# Patient Record
Sex: Female | Born: 1970 | Hispanic: No | Marital: Single
Health system: Southern US, Community
[De-identification: ages and names within clinical notes are randomized; demographics above are authoritative.]

---

## 2008-04-19 ENCOUNTER — Ambulatory Visit: Payer: Self-pay | Admitting: Family Medicine

## 2009-01-09 ENCOUNTER — Ambulatory Visit: Payer: Self-pay | Admitting: Gastroenterology

## 2009-05-14 ENCOUNTER — Ambulatory Visit: Payer: Self-pay | Admitting: Family Medicine

## 2009-06-17 ENCOUNTER — Ambulatory Visit: Payer: Self-pay

## 2009-06-18 ENCOUNTER — Ambulatory Visit: Payer: Self-pay | Admitting: Family Medicine

## 2009-08-27 ENCOUNTER — Ambulatory Visit: Payer: Self-pay | Admitting: Gastroenterology

## 2010-01-12 ENCOUNTER — Emergency Department: Payer: Self-pay | Admitting: Emergency Medicine

## 2010-07-18 ENCOUNTER — Emergency Department: Payer: Self-pay | Admitting: Internal Medicine

## 2010-11-16 ENCOUNTER — Other Ambulatory Visit: Payer: Self-pay | Admitting: Physical Medicine and Rehabilitation

## 2010-11-16 DIAGNOSIS — G8929 Other chronic pain: Secondary | ICD-10-CM

## 2010-11-16 DIAGNOSIS — M5020 Other cervical disc displacement, unspecified cervical region: Secondary | ICD-10-CM

## 2010-11-20 ENCOUNTER — Ambulatory Visit
Admission: RE | Admit: 2010-11-20 | Discharge: 2010-11-20 | Disposition: A | Discharge status: Home / Self Care | Payer: BC Managed Care – PPO | Source: Ambulatory Visit | Attending: Physical Medicine and Rehabilitation | Admitting: Physical Medicine and Rehabilitation

## 2010-11-20 DIAGNOSIS — M5020 Other cervical disc displacement, unspecified cervical region: Secondary | ICD-10-CM

## 2010-11-20 DIAGNOSIS — M542 Cervicalgia: Secondary | ICD-10-CM

## 2012-02-01 ENCOUNTER — Other Ambulatory Visit: Payer: Self-pay | Admitting: Family Medicine

## 2012-02-01 NOTE — Telephone Encounter (Signed)
No paper chart °

## 2012-02-01 NOTE — Telephone Encounter (Signed)
Please pull paper chart.  

## 2012-02-11 IMAGING — US US PELV - US TRANSVAGINAL
1 series · 17 of 25 positions shown · non-contrast
Comparison: none

REASON FOR EXAM: menorrhagia
COMMENTS:

[Series 1: us pelv - us transvaginal · 17 of 61 slices shown]
[im 1/61]
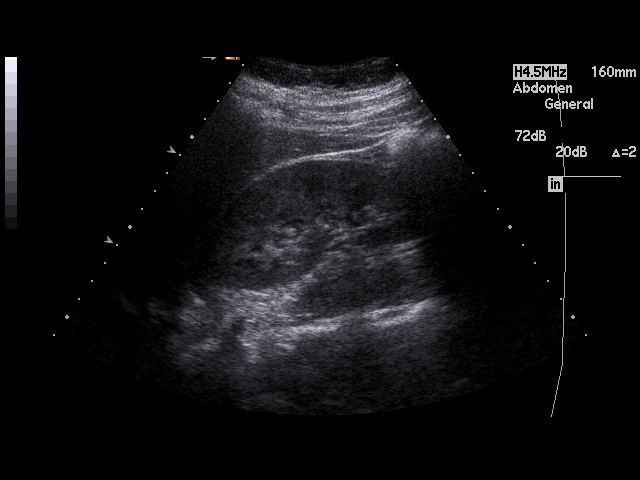
[im 6/61]
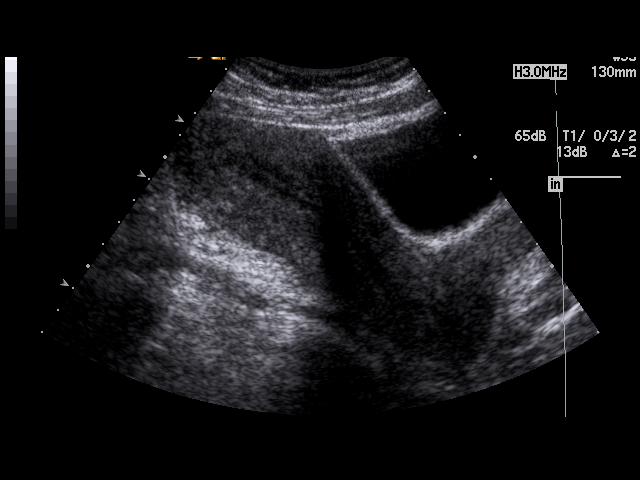
[im 8/61]
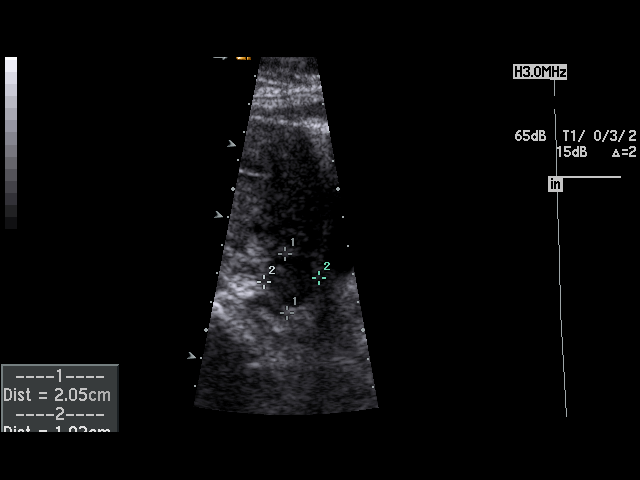
[im 13/61]
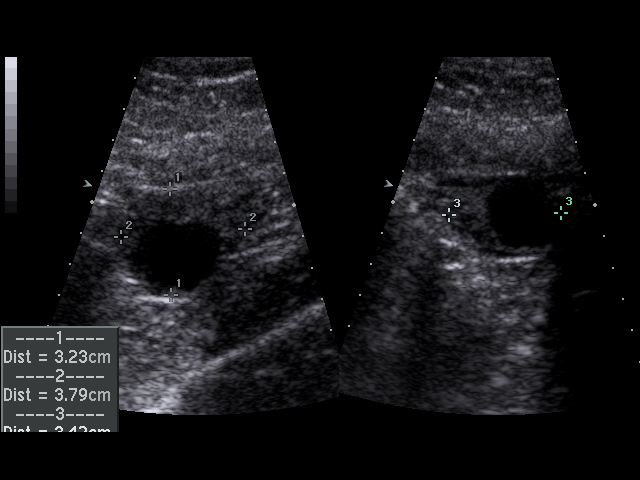
[im 16/61]
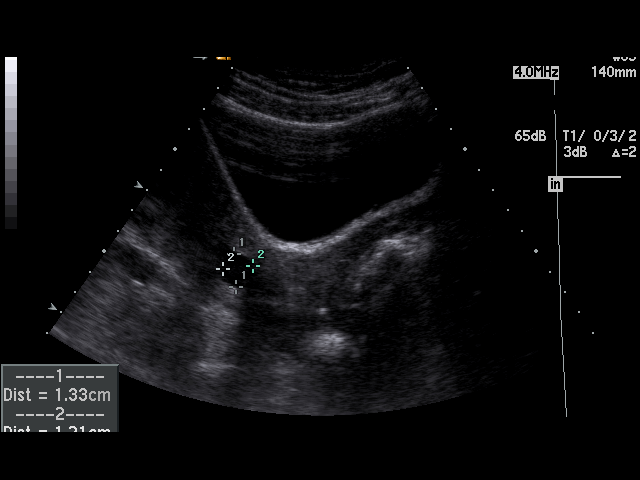
[im 21/61]
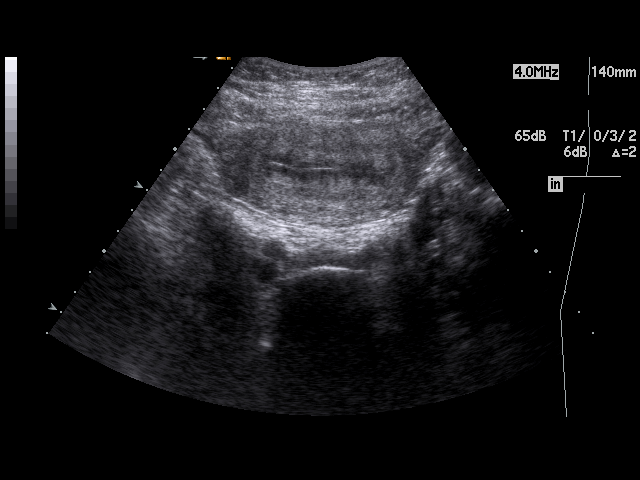
[im 23/61]
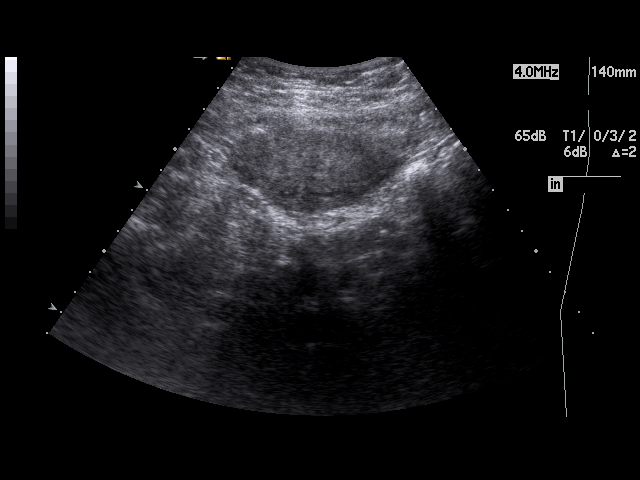
[im 28/61]
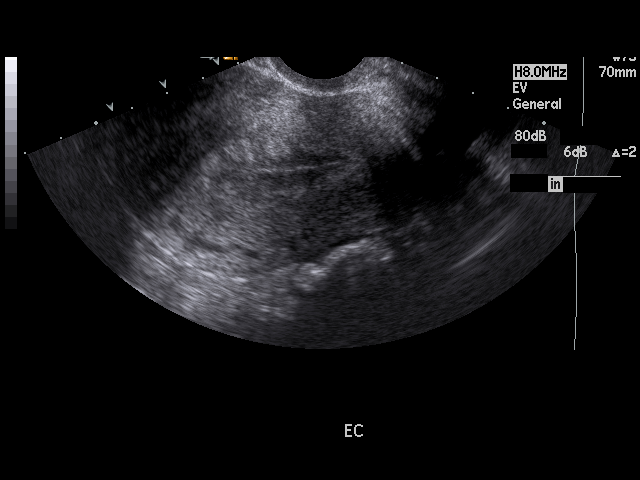
[im 31/61]
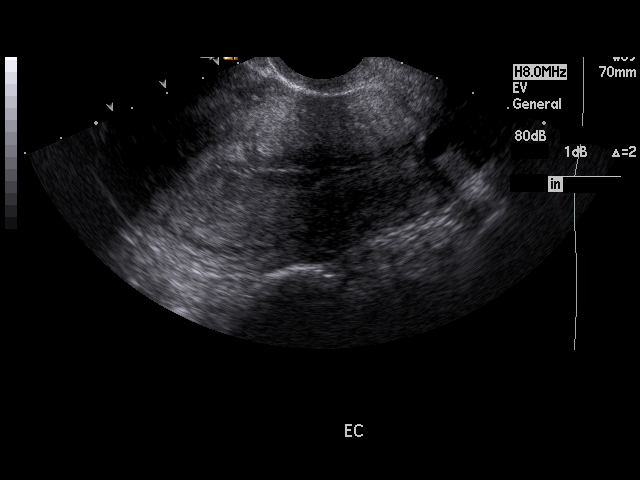
[im 33/61]
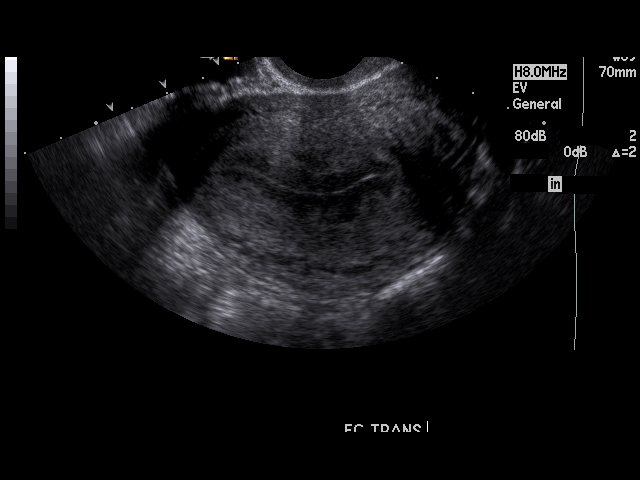
[im 38/61]
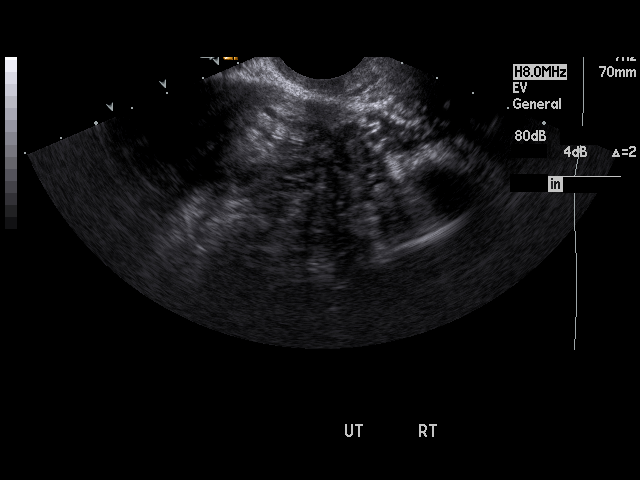
[im 41/61]
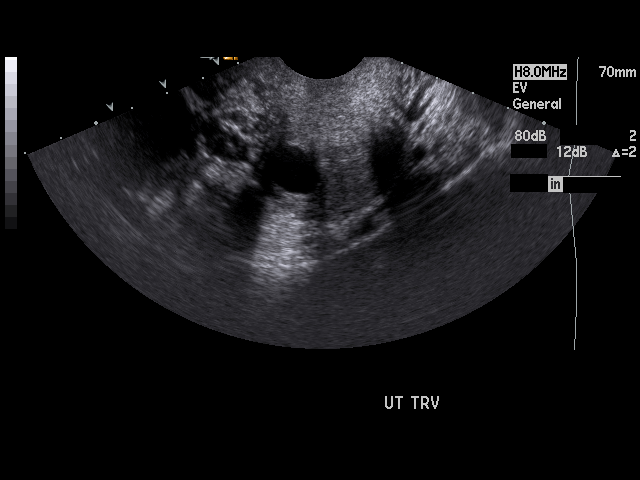
[im 46/61]
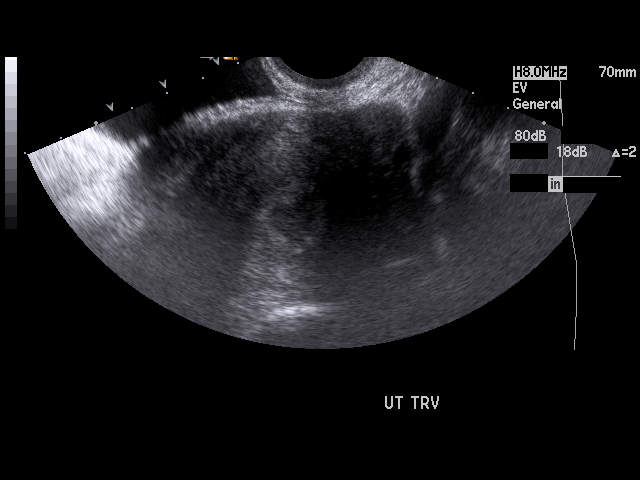
[im 48/61]
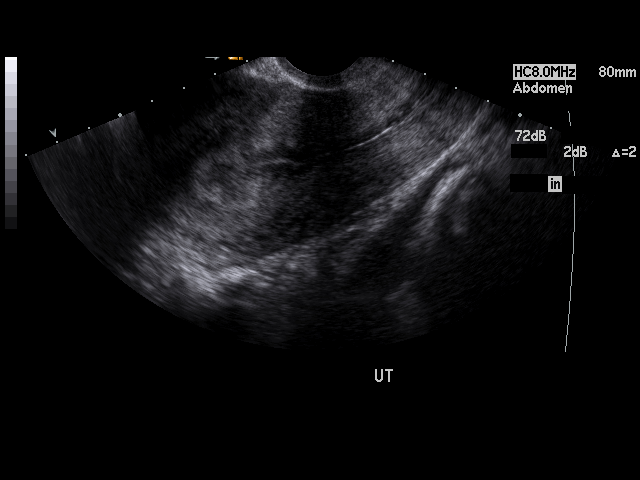
[im 53/61]
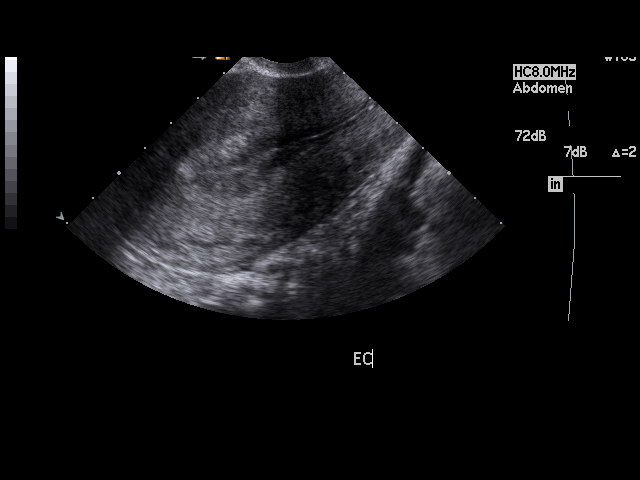
[im 56/61]
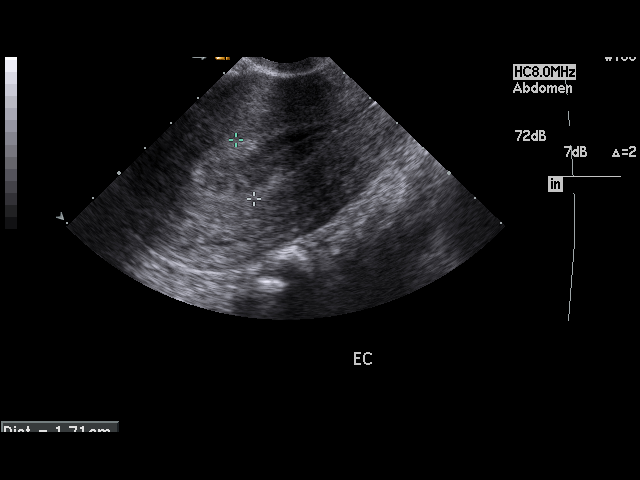
[im 61/61]
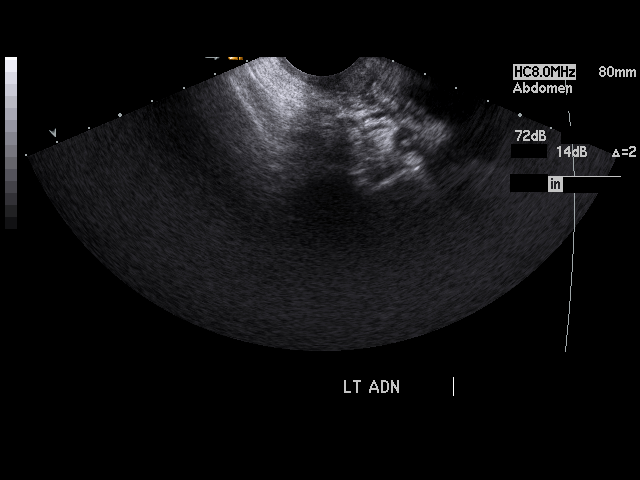

[17 of 25 positions shown; findings below may reference images not displayed]

PROCEDURE:     US  - US PELVIS EXAM W/TRANSVAGINAL  - May 14, 2009 [DATE]

RESULT:     Transabdominal and endovaginal ultrasound was performed. The
uterus measures 13.3 cm x 7.5 cm x 5.2 cm. No uterine mass lesions are seen.
The endometrium measures 17 mm in thickness. There is an irregular
appearance of the interface between the endometrium and myometrium. The
significance of this is uncertain. The right and left ovaries are
visualized. The right ovary measures 2.54 cm at maximum diameter and the
left ovary measures 3.42 cm at maximum diameter. There is a 2.59 cm cyst of
the left ovary. No additional adnexal masses are seen. There is incidentally
noted a nabothian cyst in the cervix. This cyst measures 1.33 cm in
diameter. No free fluid is seen in the pelvis. The kidneys show no
hydronephrosis. The visualized portion of the urinary bladder is normal in
appearance.
IMPRESSION: 1. No uterine mass lesions are identified.
2. The endometrium is thickened and has an irregular interface between the
endometrium and myometrium. The significance of this is to me uncertain.
3. A left ovarian cyst is noted.
4. No free fluid is seen in the pelvis.

## 2012-03-10 ENCOUNTER — Ambulatory Visit: Payer: Self-pay | Admitting: Family Medicine

## 2012-04-03 ENCOUNTER — Ambulatory Visit: Payer: Self-pay | Admitting: Family Medicine

## 2013-01-10 ENCOUNTER — Ambulatory Visit: Payer: Self-pay | Admitting: Obstetrics and Gynecology

## 2013-01-10 LAB — BASIC METABOLIC PANEL WITH GFR
Anion Gap: 6 — ABNORMAL LOW (ref 7–16)
BUN: 10 mg/dL (ref 7–18)
Calcium, Total: 8.3 mg/dL — ABNORMAL LOW (ref 8.5–10.1)
Chloride: 106 mmol/L (ref 98–107)
Co2: 24 mmol/L (ref 21–32)
Creatinine: 0.82 mg/dL (ref 0.60–1.30)
EGFR (African American): >60
EGFR (Non-African Amer.): >60
Glucose: 94 mg/dL (ref 65–99)
Osmolality: 271 (ref 275–301)
Potassium: 3.8 mmol/L (ref 3.5–5.1)
Sodium: 136 mmol/L (ref 136–145)

## 2013-01-10 LAB — CBC
HCT: 36.9 % (ref 35.0–47.0)
MCHC: 33.1 g/dL (ref 32.0–36.0)
MCV: 87 fL (ref 80–100)
RDW: 13.8 % (ref 11.5–14.5)
WBC: 5.8 10*3/uL (ref 3.6–11.0)

## 2013-01-18 ENCOUNTER — Ambulatory Visit: Payer: Self-pay | Admitting: Obstetrics and Gynecology

## 2013-01-22 LAB — PATHOLOGY REPORT

## 2013-03-26 ENCOUNTER — Ambulatory Visit: Payer: Self-pay | Admitting: Obstetrics and Gynecology

## 2013-03-26 LAB — BASIC METABOLIC PANEL
ANION GAP: 3 — AB (ref 7–16)
BUN: 11 mg/dL (ref 7–18)
CO2: 29 mmol/L (ref 21–32)
Calcium, Total: 9.2 mg/dL (ref 8.5–10.1)
Chloride: 106 mmol/L (ref 98–107)
Creatinine: 0.87 mg/dL (ref 0.60–1.30)
Glucose: 79 mg/dL (ref 65–99)
Osmolality: 274 (ref 275–301)
Potassium: 4 mmol/L (ref 3.5–5.1)
SODIUM: 138 mmol/L (ref 136–145)

## 2013-03-26 LAB — CBC
HCT: 34.7 % — AB (ref 35.0–47.0)
HGB: 11.3 g/dL — AB (ref 12.0–16.0)
MCH: 28.2 pg (ref 26.0–34.0)
MCHC: 32.5 g/dL (ref 32.0–36.0)
MCV: 87 fL (ref 80–100)
Platelet: 314 10*3/uL (ref 150–440)
RBC: 4 10*6/uL (ref 3.80–5.20)
RDW: 13.4 % (ref 11.5–14.5)
WBC: 5.5 10*3/uL (ref 3.6–11.0)

## 2013-04-05 ENCOUNTER — Ambulatory Visit: Payer: Self-pay | Admitting: Obstetrics and Gynecology

## 2013-04-06 LAB — BASIC METABOLIC PANEL
Anion Gap: 10 (ref 7–16)
BUN: 5 mg/dL — ABNORMAL LOW (ref 7–18)
CALCIUM: 8 mg/dL — AB (ref 8.5–10.1)
CREATININE: 0.87 mg/dL (ref 0.60–1.30)
Chloride: 105 mmol/L (ref 98–107)
Co2: 25 mmol/L (ref 21–32)
EGFR (African American): >60
Glucose: 109 mg/dL — ABNORMAL HIGH (ref 65–99)
OSMOLALITY: 277 (ref 275–301)
Potassium: 3.6 mmol/L (ref 3.5–5.1)
Sodium: 140 mmol/L (ref 136–145)

## 2013-04-06 LAB — CBC WITH DIFFERENTIAL/PLATELET
Basophil #: 0.1 10*3/uL (ref 0.0–0.1)
Basophil %: 0.6 %
EOS ABS: 0 10*3/uL (ref 0.0–0.7)
EOS PCT: 0 %
HCT: 31.6 % — ABNORMAL LOW (ref 35.0–47.0)
HGB: 10.2 g/dL — ABNORMAL LOW (ref 12.0–16.0)
Lymphocyte #: 1.9 10*3/uL (ref 1.0–3.6)
Lymphocyte %: 12.4 %
MCH: 27.7 pg (ref 26.0–34.0)
MCHC: 32.4 g/dL (ref 32.0–36.0)
MCV: 86 fL (ref 80–100)
MONO ABS: 1.1 x10 3/mm — AB (ref 0.2–0.9)
Monocyte %: 7.1 %
Neutrophil #: 12.4 10*3/uL — ABNORMAL HIGH (ref 1.4–6.5)
Neutrophil %: 79.9 %
PLATELETS: 232 10*3/uL (ref 150–440)
RBC: 3.69 10*6/uL — ABNORMAL LOW (ref 3.80–5.20)
RDW: 13.3 % (ref 11.5–14.5)
WBC: 15.5 10*3/uL — AB (ref 3.6–11.0)

## 2013-04-06 LAB — PATHOLOGY REPORT

## 2014-05-31 NOTE — Op Note (Signed)
PATIENT NAME:  Nancy Miller, Nancy Miller MR#:  [id number] DATE OF BIRTH:  [birth year]  DATE OF PROCEDURE:  01/18/2013  PREOPERATIVE DIAGNOSES:  1.  Menorrhagia.  2.  Severe dysmenorrhea.  3.  Chronic pelvic pain.   POSTOPERATIVE DIAGNOSES: 1.  Menorrhagia.  2.  Severe dysmenorrhea.  3.  Chronic pelvic pain.   OPERATIONS PERFORMED:  1.  Hysteroscopy.  2.  Dilatation and curettage. 3.  Diagnostic laparoscopy.  ANESTHESIA USED:  General.   PRIMARY SURGEON:  Florina OuAndreas M. Bonney AidStaebler, MD.   ESTIMATED BLOOD LOSS:  Minimal.   OPERATIVE FLUIDS:  700 mL of crystalloid.   URINE OUTPUT:  200 mL.   COMPLICATIONS:  None.   PREOPERATIVE ANTIBIOTICS:  None.   DRAINS OR TUBES:  None.   IMPLANTS:  None.   FINDINGS:  Normal cervix and cervical canal. Normal right tubal ostia, left tubal ostia with either a small submucosa fibroid versus salpingitis isthmica nodosum. The uterus sounded to a total length of 9 cm. Laparoscopy revealed normal findings other than a slightly boggy and globally enlarged uterus suggestive of possible adenomyosis.   SPECIMENS REMOVED:  Endometrial curettings.   PATIENT CONDITION FOLLOWING PROCEDURE:  Stable.   PROCEDURE IN DETAIL:  The risks, benefits, and alternatives of the procedure were discussed with the patient prior to proceeding to the operating room. The patient was taken to the operating room where she was placed under general endotracheal anesthesia. She was positioned in the dorsal lithotomy position using Allen stirrups, prepped and draped in the usual sterile fashion. A time-out was performed. Attention was turned to the patient's pelvis. A red rubber catheter was used to drain the patient's bladder prior to the beginning of the case. The operative speculum was then placed. The anterior lip of the cervix was visualized, grasped with a single-tooth tenaculum and sequentially dilated using Pratt dilators. The hysteroscope was then advanced through the cervix, revealing the  above findings. A sharp curettage was performed and the endometrial curetting sent to Pathology for analysis. The single-tooth tenaculum was replaced with a Hulka tenaculum and the operative speculum was removed. Attention was turned to the patient's abdomen where the umbilicus was infiltrated with 0.5% Sensorcaine. A stab incision was made at the base of the umbilicus and a 5-mm XL trocar was used to gain entry into the peritoneal cavity under direct visualization. A second left lateral 5-mm trocar was placed and the abdominal survey was conducted with the above findings. The pneumoperitoneum was evacuated. The trocars were removed. Each trocar site was dressed with Dermabond. Sponge, needle, and instrument counts were correct x 2. The patient tolerated the procedure well and was taken to the recovery room in stable condition.   ____________________________ Florina OuAndreas M. Bonney AidStaebler, MD ams:jm D: 01/20/2013 10:37:35 ET T: 01/20/2013 11:02:55 ET JOB#: [id number]  cc: Florina OuAndreas M. Bonney AidStaebler, MD, <Dictator> Lorrene ReidANDREAS M Chayim Bialas MD ELECTRONICALLY SIGNED 01/28/2013 19:02

## 2014-06-01 NOTE — Op Note (Signed)
PATIENT NAME:  Nancy Miller, Nancy Miller MR#:  [id number] DATE OF BIRTH:  1970/04/06  DATE OF PROCEDURE:  04/05/2013  PREOPERATIVE DIAGNOSIS: Menorrhagia and severe dysmenorrhea, suspected adenomyosis.   POSTOPERATIVE DIAGNOSIS:  Menorrhagia and severe dysmenorrhea, suspected adenomyosis.  OPERATION PERFORMED: Total laparoscopic hysterectomy, bilateral salpingectomy and cystoscopy.   ANESTHESIA USED: General.   PRIMARY SURGEON: Florina Ou. Debby Bud, M.D.   ASSISTANT: Adria Devon, M.D.   ESTIMATED BLOOD LOSS: 50 mL.   OPERATIVE FLUIDS: 1600 mL of crystalloid.   PREOPERATIVE ANTIBIOTICS: Two grams of Ancef.   DRAINS OR TUBES:  Foley to gravity drainage.   IMPLANTS: None.   COMPLICATIONS: None.   INTRAOPERATIVE FINDINGS: Normal ovaries, tubes; boggy, globally enlarged uterus. Bilateral ureters were visualized during the case, and cystoscopy at the conclusion of the case revealed an intact bladder and bilateral efflux of urine from both ureters.   SPECIMENS REMOVED: Uterus, cervix and tubes.   PATIENT CONDITION FOLLOWING PROCEDURE: Stable.   PROCEDURE IN DETAIL: Risks, benefits and alternatives of the procedure were discussed with the patient prior to proceeding to the operating room. The patient was taken to the operating room where she was placed under general endotracheal anesthesia. She was positioned in the dorsal lithotomy position using Allen stirrups, prepped and draped in the usual sterile fashion. A timeout procedure was performed prior to proceeding with the case. Attention was turned to the patient's pelvis. The bladder was drained using an indwelling Foley catheter.  An operative speculum was then placed. The anterior lip of the cervix was grasped with a single-tooth tenaculum. The uterus sounded to 9 cm. A large VCare device was then placed to allow manipulation of the uterus and delineation of the cervical cuff. Following placement of the VCare device, the operative speculum and  single-tooth tenaculum were removed. Attention was then turned to the patient's abdomen. The umbilicus was infiltrated with 1% lidocaine without epinephrine. A stab incision was made at the base of the umbilicus and a 5 mm XL trocar was used to gain entry into the peritoneal cavity under direct visualization. Pneumoperitoneum was established. Two lateral assistant ports, one left and one right, both 5 mm XL ports, were then placed. Attention was turned to the patient's left adnexa. The left tube was dissected off its attachments to the uterus and mesosalpinx using a 5 mm Harmonic device. The utero-ovarian ligament and suspensory ligament was then cut using the 5 mm Harmonic device, followed by the round ligament. The anterior leaf of the broad was dissected down to the level of the internal cervical os. The posterior leaf of the broad ligament was then taken down to the level of the uterosacral ligament on the left. The uterine artery was skeletonized before being cauterized using bipolar cautery and then transected using the 5 mm Harmonic. Attention was then turned to the patient's right adnexa. The right tube was dissected off the ovary and the mesosalpinx in a similar fashion as it had been done on the left. The utero-ovarian ligament was then transected using the 5 mm Harmonic, followed by the round ligament. The anterior leaf of the broad ligament was dissected down to the level of the internal cervical os, meeting the prior made dissection. A bladder flap was developed. The posterior leaf of the broad ligament was then dissected to the right uterosacral as had been done on the left. The uterine artery was skeletonized before being cauterized using bipolar energy and transected using the 5 mm Harmonic device. An anterior colpotomy was then performed staying  within the VCare cup and carried around circumferentially to free the specimen. Specimen was then delivered vaginally. Attention was then turned to the  patient's pelvis. A long weighted speculum was then placed  along with a Deaver to retract the anterior vaginal wall. The vaginal cuff was closed using interrupted figure-of-eight sutures of 0 Vicryl. Following this, the vaginal cuff was inspected with bimanual exam was no defects noted. Cystoscopy was performed noting the above findings. Last look was taken abdominally to look at the vaginal cuff with all pedicles noted to be hemostatic. The pelvis was irrigated and pneumoperitoneum was evacuated. The port sites were closed using Dermabond. Sponge, needle and instrument counts were correct x 2. The patient tolerated the procedure well and was taken to the recovery room in stable condition.    ____________________________ Florina OuAndreas M. Bonney AidStaebler, MD ams:dmm D: 04/06/2013 08:55:00 ET T: 04/06/2013 12:38:30 ET JOB#: [id number]  cc: Florina OuAndreas M. Bonney AidStaebler, MD, <Dictator> Carmel SacramentoANDREAS Cathrine MusterM Wyvonne Carda MD ELECTRONICALLY SIGNED 05/01/2013 12:36

## 2014-12-08 IMAGING — CR DG CHEST 2V
1 series · 2 of 2 positions shown · non-contrast
Comparison: none

REASON FOR EXAM: cough
COMMENTS:

[Series 1: pa · 0.17mm/px · 2 of 2 slices shown]
[im 1/2]
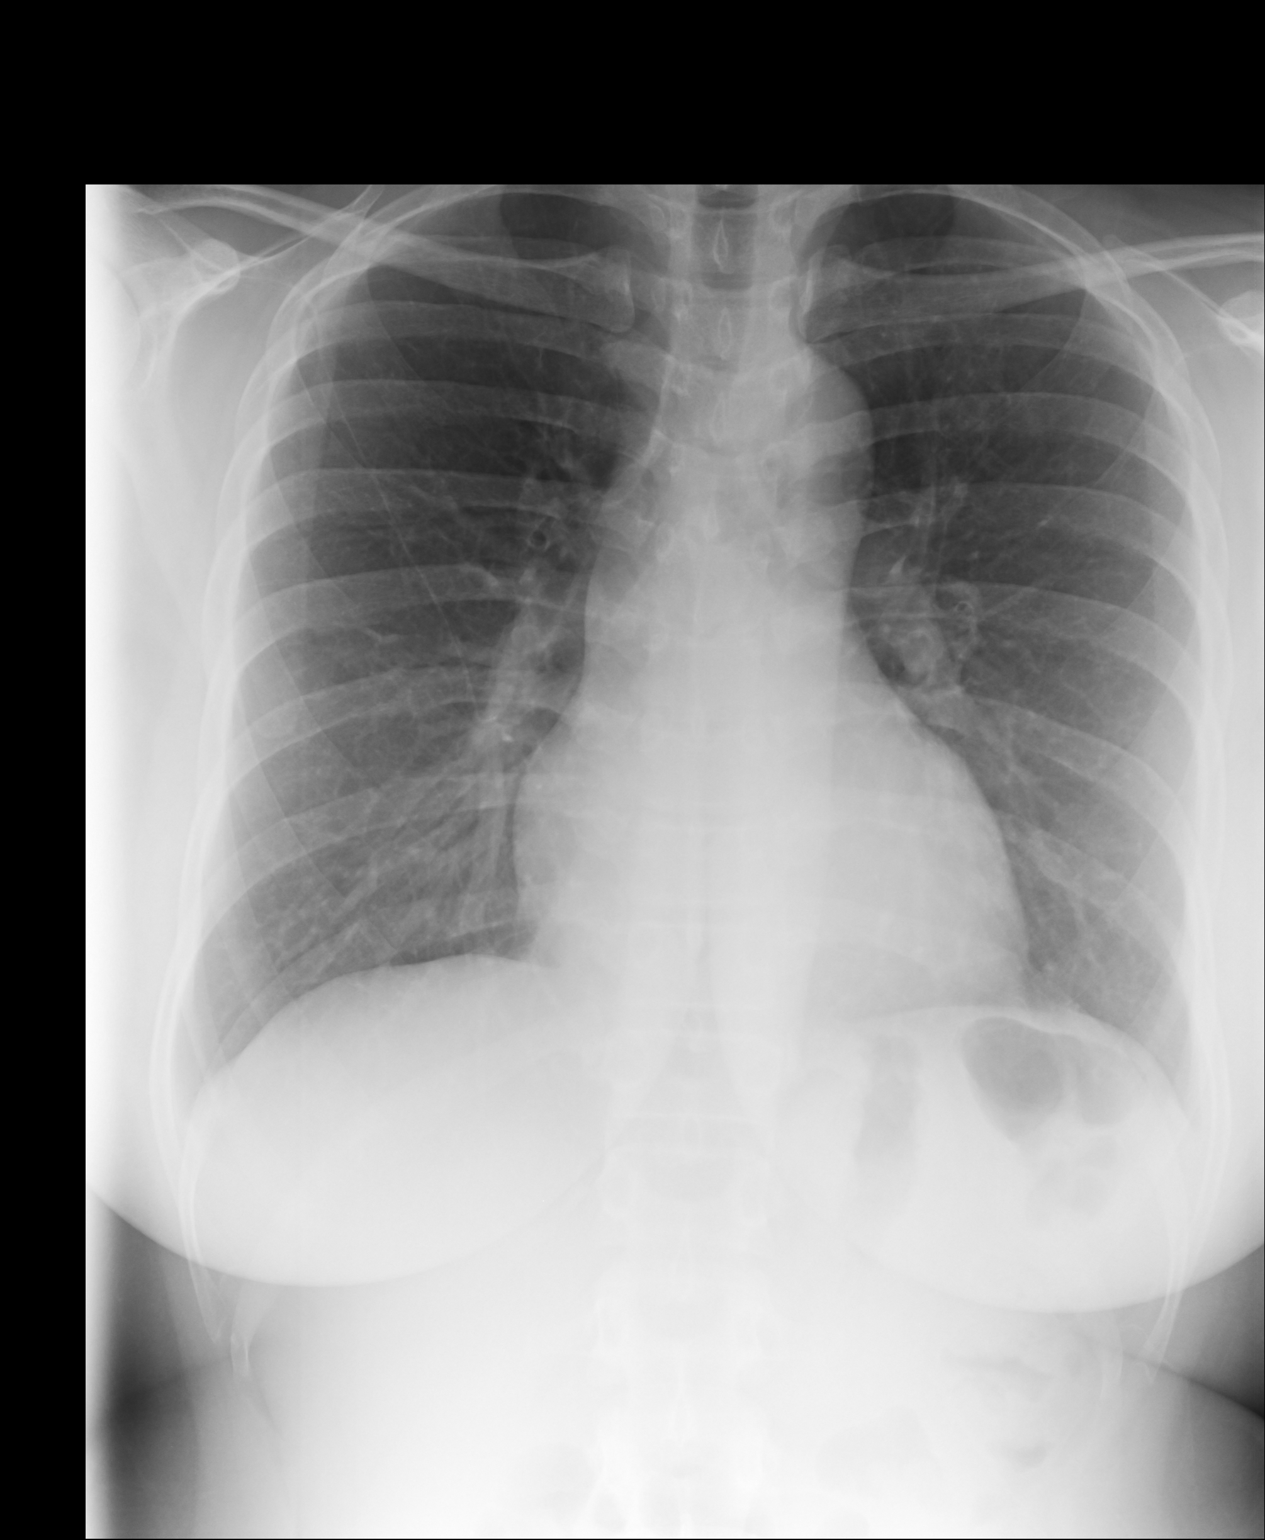
[im 2/2]
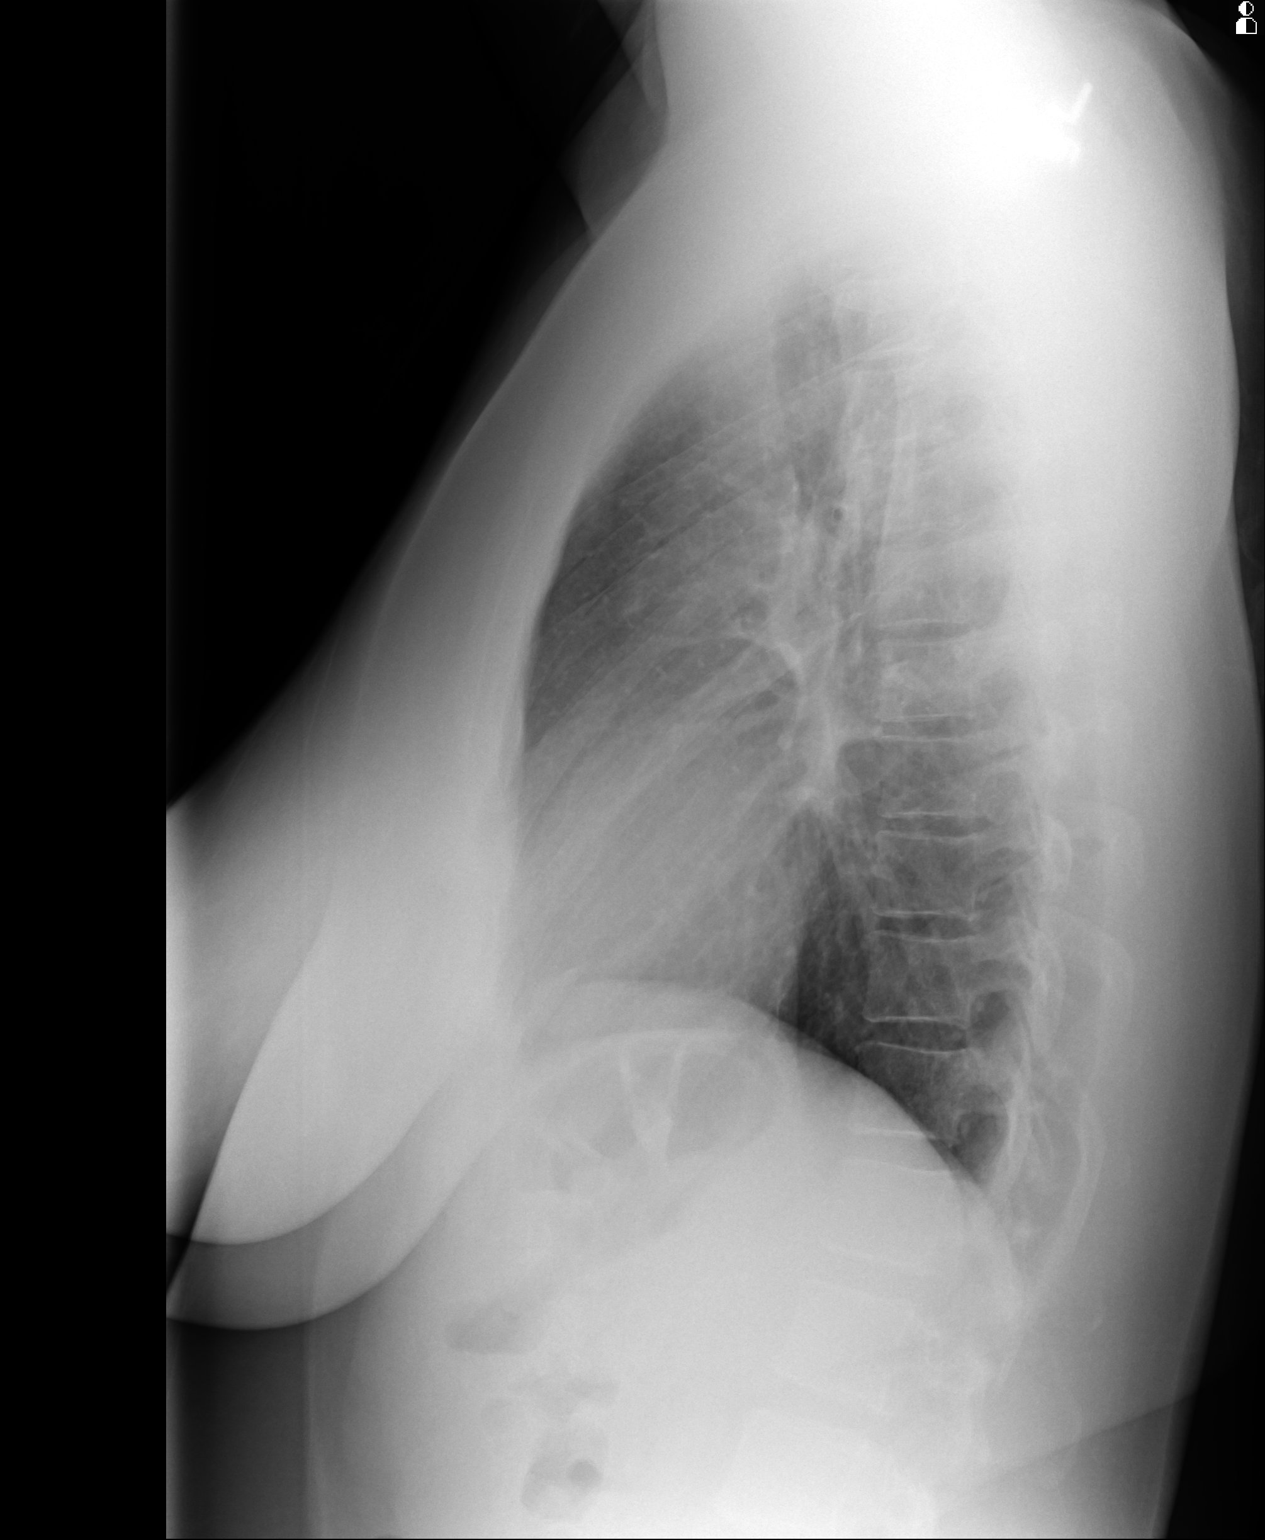

[2 of 2 positions shown; findings below may reference images not displayed]

PROCEDURE:     KDR - KDXR CHEST PA (OR AP) AND LAT  - March 10, 2012 [DATE]

RESULT:     Comparison is made to the study April 19, 2008.

The lungs are well-expanded and exhibit no focal infiltrates. Minimal
increased density in the perihilar region on the right may indicate
subsegmental atelectasis. The cardiac silhouette is normal in size. The
mediastinum is normal in width. There is no pleural effusion. The bony
thorax exhibits no acute abnormality.
IMPRESSION: There is no evidence of acute pneumonia but I cannot
exclude acute bronchitis in the appropriate clinical setting.

[REDACTED]
